# Patient Record
Sex: Female | Born: 1990 | Race: Black or African American | Hispanic: No | Marital: Single | Smoking: Never smoker
Health system: Southern US, Community
[De-identification: ages and names within clinical notes are randomized; demographics above are authoritative.]

---

## 2019-09-04 ENCOUNTER — Encounter: Payer: Self-pay | Admitting: Emergency Medicine

## 2019-09-04 ENCOUNTER — Emergency Department
Admission: EM | Admit: 2019-09-04 | Discharge: 2019-09-04 | Disposition: A | Discharge status: Home / Self Care | Payer: Self-pay | Attending: Emergency Medicine | Admitting: Emergency Medicine

## 2019-09-04 ENCOUNTER — Other Ambulatory Visit: Payer: Self-pay

## 2019-09-04 DIAGNOSIS — M545 Low back pain, unspecified: Secondary | ICD-10-CM

## 2019-09-04 DIAGNOSIS — M62830 Muscle spasm of back: Secondary | ICD-10-CM | POA: Insufficient documentation

## 2019-09-04 LAB — URINALYSIS, COMPLETE (UACMP) WITH MICROSCOPIC
Bilirubin Urine: NEGATIVE
Glucose, UA: NEGATIVE mg/dL
Hgb urine dipstick: NEGATIVE
Ketones, ur: NEGATIVE mg/dL
Leukocytes,Ua: NEGATIVE
Nitrite: NEGATIVE
Protein, ur: NEGATIVE mg/dL
Specific Gravity, Urine: 1.021 (ref 1.005–1.030)
pH: 7 (ref 5.0–8.0)

## 2019-09-04 LAB — POCT PREGNANCY, URINE: Preg Test, Ur: NEGATIVE

## 2019-09-04 MED ORDER — METHOCARBAMOL 500 MG PO TABS
500.0000 mg | ORAL_TABLET | Freq: Once | ORAL | Status: AC
  Administered 2019-09-04: 500 mg via ORAL
  Filled 2019-09-04: qty 1

## 2019-09-04 MED ORDER — KETOROLAC TROMETHAMINE 30 MG/ML IJ SOLN
30.0000 mg | Freq: Once | INTRAMUSCULAR | Status: AC
  Administered 2019-09-04: 30 mg via INTRAMUSCULAR
  Filled 2019-09-04: qty 1

## 2019-09-04 MED ORDER — KETOROLAC TROMETHAMINE 10 MG PO TABS
10.0000 mg | ORAL_TABLET | Freq: Four times a day (QID) | ORAL | 0 refills | Status: DC | PRN
Start: 2019-09-04 — End: 2021-07-24

## 2019-09-04 MED ORDER — CYCLOBENZAPRINE HCL 5 MG PO TABS
ORAL_TABLET | ORAL | 0 refills | Status: DC
Start: 2019-09-04 — End: 2021-07-24

## 2019-09-04 NOTE — ED Triage Notes (Signed)
Pt reports pain to her back. Pt denies obvious injuries and reports she has been working ut for 2 weeks but doesn't;t think that is what cause it. Pt reports it feels like spasms

## 2019-09-04 NOTE — ED Notes (Signed)
See triage note  Presents with mid back pain   States pain started yesterday w/o injury  Area is tender with touch

## 2019-09-04 NOTE — ED Provider Notes (Signed)
[facility] Emergency Department Provider Note  ____________________________________________  Time seen: Approximately 10:38 AM  I have reviewed the triage vital signs and the nursing notes.   HISTORY  Chief Complaint Back Pain    HPI Chelsea Oneill is a 29 y.o. female that presents to the emergency department for evaluation of low to mid back pain for 2 days.  Patient describes the pain as "spasms." Patient started working out 2 weeks ago.  She has been doing squats and lifting.  At first, her whole body was sore.  Now she just has pain to her low to mid back.  No recent illness.  No shortness of breath, chest pain, upper back pain, abdominal pain.  No dysuria, urgency, frequency.  No nausea, vomiting, abdominal pain.  History reviewed. No pertinent past medical history.  There are no problems to display for this patient.   History reviewed. No pertinent surgical history.  Prior to Admission medications   Medication Sig Start Date End Date Taking? Authorizing Provider  cyclobenzaprine (FLEXERIL) 5 MG tablet Take 1-2 tablets 3 times daily as needed 09/04/19   Laban Emperor, PA-C  ketorolac (TORADOL) 10 MG tablet Take 1 tablet (10 mg total) by mouth every 6 (six) hours as needed. 09/04/19   Laban Emperor, PA-C    Allergies Patient has no allergy information on record.  No family history on file.  Social History Social History   Tobacco Use  . Smoking status: Not on file  Substance Use Topics  . Alcohol use: Not on file  . Drug use: Not on file     Review of Systems  Constitutional: No fever/chills ENT: No upper respiratory complaints. Cardiovascular: No chest pain. Respiratory: No cough. No SOB. Gastrointestinal: No abdominal pain.  No nausea, no vomiting.  Genitourinary: No urinary symptoms. No dysuria, frequency, hematuria. Musculoskeletal: Positive for back pain. Skin: Negative for rash, abrasions, lacerations,  ecchymosis. Neurological: Negative for headaches, numbness or tingling   ____________________________________________   PHYSICAL EXAM:  VITAL SIGNS: ED Triage Vitals  Enc Vitals Group     BP 09/04/19 0945 118/62     Pulse Rate 09/04/19 0945 96     Resp 09/04/19 0945 20     Temp 09/04/19 0945 98.4 F (36.9 C)     Temp Source 09/04/19 0945 Oral     SpO2 09/04/19 0945 100 %     Weight 09/04/19 0942 200 lb (90.7 kg)     Height 09/04/19 0942 5\' 5"  (1.651 m)     Head Circumference --      Peak Flow --      Pain Score 09/04/19 0941 10     Pain Loc --      Pain Edu? --      Excl. in [place]? --      Constitutional: Alert and oriented. Well appearing and in no acute distress. Eyes: Conjunctivae are normal. PERRL. EOMI. Head: Atraumatic. ENT:      Ears:      Nose: No congestion/rhinnorhea.      Mouth/Throat: Mucous membranes are moist.  Neck: No stridor. No cervical spine tenderness to palpation. Cardiovascular: Normal rate, regular rhythm.  Good peripheral circulation. Respiratory: Normal respiratory effort without tachypnea or retractions. Lungs CTAB. Good air entry to the bases with no decreased or absent breath sounds. Gastrointestinal: Bowel sounds 4 quadrants. Soft and nontender to palpation. No guarding or rigidity. No palpable masses. No distention. Musculoskeletal: Full range of motion to all extremities. No gross deformities appreciated.  Tenderness to palpation to mid to low back to bilateral lumbar paraspinal muscles.  No pinpoint tenderness to palpation over lumbar spine.  Strength equal in lower extremities bilaterally. Neurologic:  Normal speech and language. No gross focal neurologic deficits are appreciated.  Skin:  Skin is warm, dry and intact. No rash noted. Psychiatric: Mood and affect are normal. Speech and behavior are normal. Patient exhibits appropriate insight and judgement.   ____________________________________________   LABS (all labs ordered are  listed, but only abnormal results are displayed)  Labs Reviewed  URINALYSIS, COMPLETE (UACMP) WITH MICROSCOPIC - Abnormal; Notable for the following components:      Result Value   Color, Urine YELLOW (*)    APPearance HAZY (*)    Bacteria, UA FEW (*)    All other components within normal limits  URINE CULTURE  POC URINE PREG, ED  POCT PREGNANCY, URINE   ____________________________________________  EKG   ____________________________________________  RADIOLOGY   No results found.  ____________________________________________    PROCEDURES  Procedure(s) performed:    Procedures    Medications  ketorolac (TORADOL) 30 MG/ML injection 30 mg (30 mg Intramuscular Given 09/04/19 1141)  methocarbamol (ROBAXIN) tablet 500 mg (500 mg Oral Given 09/04/19 1141)     ____________________________________________   INITIAL IMPRESSION / ASSESSMENT AND PLAN / ED COURSE  Pertinent labs & imaging results that were available during my care of the patient were reviewed by me and considered in my medical decision making (see chart for details).  Review of the [place] CSRS was performed in accordance of the [facility] prior to dispensing any controlled drugs.     Patient's diagnosis is consistent with muscle strain and spasm.  Vital signs and exam are reassuring. Exam is most consistent with muscle splasms.  Urinalysis shows few bacteria, which is likely from contamination.  Patient denies any urinary symptoms.  Urine will be sent for culture.  X-ray was offered and patient declines.  She was given IM Toradol and Robaxin for pain with relief of symptoms.  Patient will be discharged home with prescriptions for Toradol and Flexeril.  Patient is to follow up with primary care as directed. Patient is given ED precautions to return to the ED for any worsening or new symptoms.   Chelsea Oneill was evaluated in Emergency Department on 09/04/2019 for the symptoms described in the history of present  illness. She was evaluated in the context of the global COVID-19 pandemic, which necessitated consideration that the patient might be at risk for infection with the SARS-CoV-2 virus that causes COVID-19. Institutional protocols and algorithms that pertain to the evaluation of patients at risk for COVID-19 are in a state of rapid change based on information released by regulatory bodies including the CDC and federal and state organizations. These policies and algorithms were followed during the patient's care in the ED.  ____________________________________________  FINAL CLINICAL IMPRESSION(S) / ED DIAGNOSES  Final diagnoses:  Acute bilateral low back pain without sciatica      NEW MEDICATIONS STARTED DURING THIS VISIT:  ED Discharge Orders         Ordered    ketorolac (TORADOL) 10 MG tablet  Every 6 hours PRN     09/04/19 1233    cyclobenzaprine (FLEXERIL) 5 MG tablet     09/04/19 1233              This chart was dictated using voice recognition software/Dragon. Despite best efforts to proofread, errors can occur which can change the meaning. Any change  was purely unintentional.    Enid Derry, PA-C 09/04/19 1324    Arnaldo Natal, MD 09/04/19 1537

## 2019-09-12 LAB — SUSCEPTIBILITY RESULT

## 2019-09-12 LAB — SUSCEPTIBILITY, AER + ANAEROB: Source of Sample: 8860

## 2019-09-13 LAB — URINE CULTURE: Culture: >=100000 — AB

## 2020-04-20 ENCOUNTER — Encounter: Payer: Self-pay | Admitting: Emergency Medicine

## 2020-04-20 ENCOUNTER — Emergency Department
Admission: EM | Admit: 2020-04-20 | Discharge: 2020-04-20 | Disposition: A | Discharge status: Home / Self Care | Payer: HRSA Program | Attending: Emergency Medicine | Admitting: Emergency Medicine

## 2020-04-20 ENCOUNTER — Other Ambulatory Visit: Payer: Self-pay

## 2020-04-20 DIAGNOSIS — U071 COVID-19: Secondary | ICD-10-CM | POA: Insufficient documentation

## 2020-04-20 DIAGNOSIS — M7918 Myalgia, other site: Secondary | ICD-10-CM | POA: Diagnosis present

## 2020-04-20 LAB — POC SARS CORONAVIRUS 2 AG -  ED: SARS Coronavirus 2 Ag: POSITIVE — AB

## 2020-04-20 MED ORDER — ONDANSETRON 4 MG PO TBDP
ORAL_TABLET | ORAL | Status: AC
  Administered 2020-04-20: 4 mg
  Filled 2020-04-20: qty 1

## 2020-04-20 MED ORDER — NAPROXEN 500 MG PO TABS
500.0000 mg | ORAL_TABLET | Freq: Once | ORAL | Status: AC
  Administered 2020-04-20: 500 mg via ORAL
  Filled 2020-04-20: qty 1

## 2020-04-20 MED ORDER — ONDANSETRON 4 MG PO TBDP
4.0000 mg | ORAL_TABLET | Freq: Once | ORAL | Status: AC
  Administered 2020-04-20: 4 mg via ORAL
  Filled 2020-04-20: qty 1

## 2020-04-20 MED ORDER — ONDANSETRON 4 MG PO TBDP: 4.0000 mg | ORAL_TABLET | Freq: Three times a day (TID) | ORAL | 0 refills | Status: DC | PRN

## 2020-04-20 MED ORDER — NAPROXEN 500 MG PO TABS: 500.0000 mg | ORAL_TABLET | Freq: Two times a day (BID) | ORAL | 0 refills | Status: DC

## 2020-04-20 NOTE — ED Triage Notes (Signed)
C/O headache and body aches since last night.  AAOx3.  Patient moaning while in triage

## 2020-04-20 NOTE — ED Notes (Addendum)
Pt requesting to stay and be seen.  Loralyn Freshwater NP at bedside . Pt calming, c/o of head to toe pain , headache , body aches

## 2020-04-20 NOTE — ED Provider Notes (Signed)
[facility] Emergency Department Provider Note  ____________________________________________  Time seen: Approximately 10:16 AM  I have reviewed the triage vital signs and the nursing notes.   HISTORY  Chief Complaint Generalized Body Aches and Headache   HPI Velisa Regnier is a 30 y.o. female who presents to the emergency department for treatment and evaluation of bodyaches, headache, and nausea since last night. She took ibuprofen without relief. Others at home have similar symptoms, but no one has been tested for COVID.    History reviewed. No pertinent past medical history.  There are no problems to display for this patient.   History reviewed. No pertinent surgical history.  Prior to Admission medications   Medication Sig Start Date End Date Taking? Authorizing Provider  naproxen (NAPROSYN) 500 MG tablet Take 1 tablet (500 mg total) by mouth 2 (two) times daily with a meal. 04/20/20  Yes Meyli Boice B, FNP  ondansetron (ZOFRAN-ODT) 4 MG disintegrating tablet Take 1 tablet (4 mg total) by mouth every 8 (eight) hours as needed for nausea or vomiting. 04/20/20  Yes Aleeah Greeno B, FNP  cyclobenzaprine (FLEXERIL) 5 MG tablet Take 1-2 tablets 3 times daily as needed 09/04/19   Enid Derry, PA-C  ketorolac (TORADOL) 10 MG tablet Take 1 tablet (10 mg total) by mouth every 6 (six) hours as needed. 09/04/19   Enid Derry, PA-C    Allergies Patient has no known allergies.  No family history on file.  Social History Social History   Tobacco Use  . Smoking status: Never Smoker  . Smokeless tobacco: Never Used    Review of Systems Constitutional: Positive for fever/chills. Decreased appetite. ENT: Positive for sore throat. Cardiovascular: Denies chest pain. Respiratory: Negative for shortness of breath. Positive for cough. Negative for wheezing.  Gastrointestinal: Positive for nausea,  Negative for vomiting.  no diarrhea.  Musculoskeletal:  Positive for body aches Skin: Negative for rash. Neurological: Positive for headaches ____________________________________________   PHYSICAL EXAM:  VITAL SIGNS: ED Triage Vitals  Enc Vitals Group     BP 04/20/20 0849 122/79     Pulse Rate 04/20/20 0849 (!) 104     Resp 04/20/20 0849 (!) 40     Temp 04/20/20 0849 99.1 F (37.3 C)     Temp Source 04/20/20 0849 Oral     SpO2 04/20/20 0849 100 %     Weight 04/20/20 0845 199 lb 15.3 oz (90.7 kg)     Height 04/20/20 0845 5\' 5"  (1.651 m)     Head Circumference --      Peak Flow --      Pain Score 04/20/20 0844 10     Pain Loc --      Pain Edu? --      Excl. in [place]? --     Constitutional: Alert and oriented. Uncomfortable appearing and in no acute distress. Eyes: Conjunctivae are normal. Ears: exam deferred Nose: No sinus congestion noted; no rhinnorhea. Mouth/Throat: Mucous membranes are moist.  Oropharynx erythematous. Tonsils normal. Uvula midline. Neck: No stridor.  Lymphatic: No cervical lymphadenopathy. Cardiovascular: Normal rate, regular rhythm. Good peripheral circulation. Respiratory: Respirations are even and unlabored.  No retractions. Breath sounds clear to auscultation. Gastrointestinal: Soft and nontender.  Musculoskeletal: FROM x 4 extremities.  Neurologic:  Normal speech and language. Skin:  Skin is warm, dry and intact. No rash noted. Psychiatric: Mood and affect are normal. Speech and behavior are normal.  ____________________________________________   LABS (all labs ordered are listed, but only abnormal results  are displayed)  Labs Reviewed  POC SARS CORONAVIRUS 2 AG -  ED - Abnormal; Notable for the following components:      Result Value   SARS Coronavirus 2 Ag POSITIVE (*)    All other components within normal limits   ____________________________________________  EKG  Not indicated. ____________________________________________  RADIOLOGY  Not  indicated. ____________________________________________   PROCEDURES  Procedure(s) performed: None  Critical Care performed: No ____________________________________________   INITIAL IMPRESSION / ASSESSMENT AND PLAN / ED COURSE  30 y.o. female presents to the emergency department for treatment and evaluation of COVID symptoms.   Patient initially found by RN lying in the floor where she had spit. She had also turned the water on in the sink and left it running. Patient states that she did all this because her body hurts, she doesn't feel good, and the floor feels better than the bed. She became very upset when the RN asked her to get off the floor so she could be evaluated. She proceeded to curse and shout until security staff came to speak with her. We were able to reach an agreement for her to be evaluated after security staff spoke with her.  COVID antigen test is positive. While here she received naprosyn and zofran with relief of headache and nausea. She was apologetic of her earlier actions.  She will be discharged home with zofran and naprosyn. She was given quarantine instructions as well. She was advised to return to the ER for symptoms that change or worsen or for new concerns.   Medications  naproxen (NAPROSYN) tablet 500 mg (500 mg Oral Given 04/20/20 0952)  ondansetron (ZOFRAN-ODT) disintegrating tablet 4 mg (4 mg Oral Given 04/20/20 0955)  ondansetron (ZOFRAN-ODT) 4 MG disintegrating tablet (4 mg  Given 04/20/20 8416)    ED Discharge Orders         Ordered    naproxen (NAPROSYN) 500 MG tablet  2 times daily with meals        04/20/20 1022    ondansetron (ZOFRAN-ODT) 4 MG disintegrating tablet  Every 8 hours PRN        04/20/20 1022           Pertinent labs & imaging results that were available during my care of the patient were reviewed by me and considered in my medical decision making (see chart for details).    If controlled substance prescribed during this  visit, 12 month history viewed on the [place] prior to issuing an initial prescription for Schedule II or III opiod. ____________________________________________   FINAL CLINICAL IMPRESSION(S) / ED DIAGNOSES  Final diagnoses:  COVID    Note:  This document was prepared using Dragon voice recognition software and may include unintentional dictation errors.    Chinita Pester, FNP 04/21/20 1812    Sharman Cheek, MD 04/21/20 2258

## 2020-04-20 NOTE — ED Notes (Signed)
Registration saw the patient lying on the floor with her head on a fanny pack and spit on the floor and the sink running. This Clinical research associate went In and told the patient that she needed to get up onto the stretcher or chair so she could be assessed and patient said she was too sick. Patient then became agitated and argumentative and got up by herself and started yelling that this Clinical research associate was rude. This Clinical research associate said she would leave, but the patient needed to calm down and sit on the chair or on the stretcher so we could take care of her. Patient began pacing, swearing and still yelling. NP is aware. Security called to speak with the patient. Patient also yelled at registration.

## 2020-04-20 NOTE — Discharge Instructions (Signed)
Take medications as prescribed.  Follow up with the primary care provider of your choice for symptoms of concern.

## 2020-07-06 ENCOUNTER — Emergency Department
Admission: EM | Admit: 2020-07-06 | Discharge: 2020-07-06 | Disposition: A | Discharge status: Home / Self Care | Payer: Medicaid Other | Attending: Emergency Medicine | Admitting: Emergency Medicine

## 2020-07-06 ENCOUNTER — Emergency Department: Payer: Medicaid Other

## 2020-07-06 ENCOUNTER — Other Ambulatory Visit: Payer: Self-pay

## 2020-07-06 ENCOUNTER — Encounter: Payer: Self-pay | Admitting: Emergency Medicine

## 2020-07-06 DIAGNOSIS — B070 Plantar wart: Secondary | ICD-10-CM

## 2020-07-06 NOTE — Discharge Instructions (Signed)
Please alternate Tylenol and ibuprofen as needed for symptoms.  Please follow-up with podiatry as soon as possible for treatment for suspected plantars wart.

## 2020-07-06 NOTE — ED Triage Notes (Signed)
Pt comes into the ED via POV c/o right great toe pain on the underside. Pt denies any injury to the toe and patient ambulatory to triage.

## 2020-07-06 NOTE — ED Provider Notes (Signed)
[facility] Emergency Department Provider Note  ____________________________________________   Event Date/Time   First MD Initiated Contact with Patient 07/06/20 1159     (approximate)  I have reviewed the triage vital signs and the nursing notes.   HISTORY  Chief Complaint Toe Pain  HPI Chelsea Oneill is a 30 y.o. female who presents to emergency department for evaluation of right great toe pain.  Patient states the pain on the underside has been there for roughly 3 to 4 months.  She states that the pain is so significant she is now walking on the outside of her foot causing pain on the outside.  She denies any known trauma or injury that occurred causing the symptoms.  She denies attempting to treat this with any over-the-counter medications.       History reviewed. No pertinent past medical history.  There are no problems to display for this patient.   Past Surgical History:  Procedure Laterality Date  . CESAREAN SECTION      Prior to Admission medications   Medication Sig Start Date End Date Taking? Authorizing Provider  cyclobenzaprine (FLEXERIL) 5 MG tablet Take 1-2 tablets 3 times daily as needed 09/04/19   Enid Derry, PA-C  ketorolac (TORADOL) 10 MG tablet Take 1 tablet (10 mg total) by mouth every 6 (six) hours as needed. 09/04/19   Enid Derry, PA-C  naproxen (NAPROSYN) 500 MG tablet Take 1 tablet (500 mg total) by mouth 2 (two) times daily with a meal. 04/20/20   Triplett, Cari B, FNP  ondansetron (ZOFRAN-ODT) 4 MG disintegrating tablet Take 1 tablet (4 mg total) by mouth every 8 (eight) hours as needed for nausea or vomiting. 04/20/20   Chinita Pester, FNP    Allergies Patient has no known allergies.  History reviewed. No pertinent family history.  Social History Social History   Tobacco Use  . Smoking status: Never Smoker  . Smokeless tobacco: Never Used  Substance Use Topics  . Alcohol use: Not Currently  . Drug use:  Yes    Types: Marijuana    Review of Systems Constitutional: No fever/chills Eyes: No visual changes. ENT: No sore throat. Cardiovascular: Denies chest pain. Respiratory: Denies shortness of breath. Gastrointestinal: No abdominal pain.  No nausea, no vomiting.  No diarrhea.  No constipation. Genitourinary: Negative for dysuria. Musculoskeletal: + Right foot pain, negative for back pain. Skin: Negative for rash. Neurological: Negative for headaches, focal weakness or numbness.  ____________________________________________   PHYSICAL EXAM:  VITAL SIGNS: ED Triage Vitals [07/06/20 1138]  Enc Vitals Group     BP 125/68     Pulse Rate 75     Resp 17     Temp 98.2 F (36.8 C)     Temp Source Oral     SpO2 99 %     Weight 179 lb (81.2 kg)     Height 5\' 5"  (1.651 m)     Head Circumference      Peak Flow      Pain Score 8     Pain Loc      Pain Edu?      Excl. in [place]?    Constitutional: Alert and oriented. Well appearing and in no acute distress. Eyes: Conjunctivae are normal. PERRL. EOMI. Head: Atraumatic. Nose: No congestion/rhinnorhea. Mouth/Throat: Mucous membranes are moist.  Oropharynx non-erythematous. Neck: No stridor.   Musculoskeletal: There is tenderness to the plantar surface of the right great toe.  At this location, there is a  1 cm x 1 cm raised callus appearing lesion with dark center, concerning for possible plantars wart.  There is no erythema or warmth over the first MTP joint line.  Range of motion of the digits is full.  Dorsal pedal pulse 2+, capillary refill less than 3 seconds all digits. Neurologic:  Normal speech and language. No gross focal neurologic deficits are appreciated.  Skin:  Skin is warm, dry and intact. No rash noted. Psychiatric: Mood and affect are normal. Speech and behavior are normal.  ____________________________________________  RADIOLOGY I, Lucy Chris, personally viewed and evaluated these images (plain radiographs) as  part of my medical decision making, as well as reviewing the written report by the radiologist.  ED provider interpretation: No significant displaced fracture, subtle area in the distal phalanx noted by radiology to be nondisplaced fracture versus vascular channel is favored by myself to be probable vascular channel in the absence of trauma and given that patient's pain is closer to the MTP joint.  Official radiology report(s): DG Toe Great Right  Result Date: 07/06/2020 CLINICAL DATA:  Right toe pain.  No injury. EXAM: RIGHT GREAT TOE COMPARISON:  No prior. FINDINGS: A very subtle nondisplaced fracture versus prominent vascular channel noted of the distal phalanx of the right great toe. Small exostosis noted along the medial base of the distal phalanx. No radiopaque foreign body. IMPRESSION: 1. A very subtle nondisplaced fracture versus prominent vascular channel noted of the distal phalanx of the right great toe. No evidence of displaced fracture or dislocation. 2. Small exostosis noted along the medial base of the distal phalanx of the right great toe. Electronically Signed   By: Maisie Fus  Register   On: 07/06/2020 13:19   ____________________________________________   INITIAL IMPRESSION / ASSESSMENT AND PLAN / ED COURSE  As part of my medical decision making, I reviewed the following data within the electronic MEDICAL RECORD NUMBER Nursing notes reviewed and incorporated, Radiograph reviewed and Notes from prior ED visits        Patient is a 30 year old female who presents to the emergency department for evaluation of right great toe pain that has been worsening over the last several months, see HPI for further details.  In triage, patient has normal vital signs.  On physical exam, she does have a soft tissue growth on the plantar surface of the right great toe, close to the MTP joint line.  This is the area of tenderness for her and the appearance is concerning for plantars wart.  Advised the  patient the lack of treatment options for this in the ED, and recommended close follow-up with podiatry.  She is amenable with this plan, will refer the patient to podiatry for definitive management.  Patient is stable at this time for outpatient follow-up.      ____________________________________________   FINAL CLINICAL IMPRESSION(S) / ED DIAGNOSES  Final diagnoses:  Plantar wart of right foot     ED Discharge Orders    None      *Please note:  Chelsea Oneill was evaluated in Emergency Department on 07/06/2020 for the symptoms described in the history of present illness. She was evaluated in the context of the global COVID-19 pandemic, which necessitated consideration that the patient might be at risk for infection with the SARS-CoV-2 virus that causes COVID-19. Institutional protocols and algorithms that pertain to the evaluation of patients at risk for COVID-19 are in a state of rapid change based on information released by regulatory bodies including the CDC and  federal and state organizations. These policies and algorithms were followed during the patient's care in the ED.  Some ED evaluations and interventions may be delayed as a result of limited staffing during and the pandemic.*   Note:  This document was prepared using Dragon voice recognition software and may include unintentional dictation errors.   Lucy Chris, PA 07/07/20 4650    Concha Se, MD 07/07/20 [phone]

## 2020-07-28 ENCOUNTER — Other Ambulatory Visit: Payer: Self-pay

## 2020-07-28 ENCOUNTER — Ambulatory Visit (LOCAL_COMMUNITY_HEALTH_CENTER): Payer: Self-pay

## 2020-07-28 VITALS — BP 97/68 | Ht 65.5 in | Wt 184.0 lb

## 2020-07-28 DIAGNOSIS — Z3201 Encounter for pregnancy test, result positive: Secondary | ICD-10-CM

## 2020-07-28 LAB — PREGNANCY, URINE: Preg Test, Ur: POSITIVE — AB

## 2020-07-28 MED ORDER — PRENATAL 27-0.8 MG PO TABS: 1.0000 | ORAL_TABLET | Freq: Every day | ORAL | 0 refills | Status: AC

## 2020-07-28 NOTE — Progress Notes (Signed)
UPT positive today. Plans prenatal care at [facility]. Advised to establish prenatal care early as pt reports "thin uterine wall." To DSS clerk for Medicaid/Preg W. Jerel Shepherd, RN

## 2021-07-24 ENCOUNTER — Emergency Department: Payer: BC Managed Care – PPO

## 2021-07-24 ENCOUNTER — Other Ambulatory Visit: Payer: Self-pay

## 2021-07-24 ENCOUNTER — Encounter: Payer: Self-pay | Admitting: Emergency Medicine

## 2021-07-24 ENCOUNTER — Emergency Department
Admission: EM | Admit: 2021-07-24 | Discharge: 2021-07-24 | Disposition: A | Discharge status: Home / Self Care | Payer: BC Managed Care – PPO | Attending: Emergency Medicine | Admitting: Emergency Medicine

## 2021-07-24 DIAGNOSIS — R0789 Other chest pain: Secondary | ICD-10-CM | POA: Diagnosis present

## 2021-07-24 LAB — CBC
HCT: 39.2 % (ref 36.0–46.0)
Hemoglobin: 12.3 g/dL (ref 12.0–15.0)
MCH: 26.6 pg (ref 26.0–34.0)
MCHC: 31.4 g/dL (ref 30.0–36.0)
MCV: 84.7 fL (ref 80.0–100.0)
Platelets: 366 10*3/uL (ref 150–400)
RBC: 4.63 MIL/uL (ref 3.87–5.11)
RDW: 19 % — ABNORMAL HIGH (ref 11.5–15.5)
WBC: 6.9 10*3/uL (ref 4.0–10.5)
nRBC: 0 % (ref 0.0–0.2)

## 2021-07-24 LAB — TROPONIN I (HIGH SENSITIVITY): Troponin I (High Sensitivity): 3 ng/L (ref <18)

## 2021-07-24 LAB — POC URINE PREG, ED: Preg Test, Ur: NEGATIVE

## 2021-07-24 MED ORDER — NAPROXEN 500 MG PO TABS
500.0000 mg | ORAL_TABLET | Freq: Once | ORAL | Status: AC
  Administered 2021-07-24: 500 mg via ORAL
  Filled 2021-07-24: qty 1

## 2021-07-24 MED ORDER — NAPROXEN 500 MG PO TABS: 500.0000 mg | ORAL_TABLET | Freq: Two times a day (BID) | ORAL | 2 refills | Status: AC

## 2021-07-24 NOTE — ED Triage Notes (Signed)
Pt with chest pain that started last week. Pt reports pain worse with exertion. Pain 5/10 now. ?

## 2021-07-24 NOTE — ED Notes (Signed)
See triage note  presents with some chest pain since last week  states pain has changed in intensity of the past few days  denies any fever ,cough or n/v  states she has gotten min relief with OTC meds ?

## 2021-07-24 NOTE — ED Provider Notes (Signed)
? ?[facility] ?Provider Note ? ? ? Event Date/Time  ? First MD Initiated Contact with Patient 07/24/21 1157   ?  (approximate) ? ? ?History  ? ?Chest Pain ? ? ?HPI ? ?Chelsea Oneill is a 31 y.o. female who presents with complaints of left-sided chest discomfort.  Patient reports this is been ongoing for about 2 to 3 days.  It is worse when she moves her arm in certain ways.  She denies fevers or chills.  No shortness of breath.  No pleurisy.  No injury to the area.  No history of heart disease ?  ? ? ?Physical Exam  ? ?Triage Vital Signs: ?ED Triage Vitals  ?Enc Vitals Group  ?   BP 07/24/21 1147 (!) 129/97  ?   Pulse Rate 07/24/21 1147 76  ?   Resp 07/24/21 1147 18  ?   Temp 07/24/21 1147 98.5 ?F (36.9 ?C)  ?   Temp Source 07/24/21 1147 Oral  ?   SpO2 07/24/21 1147 99 %  ?   Weight 07/24/21 1151 86.6 kg (191 lb)  ?   Height 07/24/21 1151 1.651 m (5\' 5" )  ?   Head Circumference --   ?   Peak Flow --   ?   Pain Score 07/24/21 1151 5  ?   Pain Loc --   ?   Pain Edu? --   ?   Excl. in GC? --   ? ? ?Most recent vital signs: ?Vitals:  ? 07/24/21 1338 07/24/21 1345  ?BP:  103/75  ?Pulse:  81  ?Resp: 14 14  ?Temp:  98 ?F (36.7 ?C)  ?SpO2:  97%  ? ? ? ?General: Awake, no distress.  ?CV:  Good peripheral perfusion.  Patient has point tenderness overlying the left pectoralis muscle which completely reproduces her pain, no rash or bruising ?Resp:  Normal effort.  ?Abd:  No distention.  ?Other:  No calf pain or swelling ? ? ?ED Results / Procedures / Treatments  ? ?Labs ?(all labs ordered are listed, but only abnormal results are displayed) ?Labs Reviewed  ?CBC - Abnormal; Notable for the following components:  ?    Result Value  ? RDW 19.0 (*)   ? All other components within normal limits  ?BASIC METABOLIC PANEL  ?POC URINE PREG, ED  ?TROPONIN I (HIGH SENSITIVITY)  ?TROPONIN I (HIGH SENSITIVITY)  ? ? ? ?EKG ? ?ED ECG REPORT ?I, 07/26/21, the attending physician, personally viewed and interpreted  this ECG. ? ?Date: 07/24/2021 ? ?Rhythm: normal sinus rhythm ?QRS Axis: normal ?Intervals: normal ?ST/T Wave abnormalities: normal ?Narrative Interpretation: no evidence of acute ischemia ? ? ? ?RADIOLOGY ?Chest x-ray viewed interpreted by me, no acute abnormality ? ? ? ?PROCEDURES: ? ?Critical Care performed:  ? ?Procedures ? ? ?MEDICATIONS ORDERED IN ED: ?Medications  ?naproxen (NAPROSYN) tablet 500 mg (500 mg Oral Given 07/24/21 1325)  ? ? ? ?IMPRESSION / MDM / ASSESSMENT AND PLAN / ED COURSE  ?I reviewed the triage vital signs and the nursing notes. ? ?Patient well-appearing and in no acute distress, EKG is reassuring ? ?Symptoms not consistent with ACS, reassuring EKG, high sensitive troponin is normal. ? ?She has point tenderness in the area consistent with musculoskeletal pain. ? ?Chest x-ray is negative for pneumonia or pneumothorax ? ?Patient treated with NSAIDs, prescription provided, outpatient follow-up recommended.  Return precautions discussed ? ? ? ? ? ?  ? ? ?FINAL CLINICAL IMPRESSION(S) / ED DIAGNOSES  ? ?Final  diagnoses:  ?Chest wall pain  ? ? ? ?Rx / DC Orders  ? ?ED Discharge Orders   ? ?      Ordered  ?  naproxen (NAPROSYN) 500 MG tablet  2 times daily with meals       ? 07/24/21 1326  ? ?  ?  ? ?  ? ? ? ?Note:  This document was prepared using Dragon voice recognition software and may include unintentional dictation errors. ?  ?Jene Every, MD ?07/24/21 1606 ? ?

## 2021-11-17 ENCOUNTER — Emergency Department
Admission: EM | Admit: 2021-11-17 | Discharge: 2021-11-17 | Disposition: A | Discharge status: Home / Self Care | Payer: BC Managed Care – PPO | Attending: Emergency Medicine | Admitting: Emergency Medicine

## 2021-11-17 ENCOUNTER — Telehealth: Payer: Self-pay | Admitting: Emergency Medicine

## 2021-11-17 ENCOUNTER — Other Ambulatory Visit: Payer: Self-pay

## 2021-11-17 DIAGNOSIS — S0993XA Unspecified injury of face, initial encounter: Secondary | ICD-10-CM | POA: Diagnosis present

## 2021-11-17 DIAGNOSIS — S025XXB Fracture of tooth (traumatic), initial encounter for open fracture: Secondary | ICD-10-CM | POA: Diagnosis not present

## 2021-11-17 DIAGNOSIS — K047 Periapical abscess without sinus: Secondary | ICD-10-CM

## 2021-11-17 DIAGNOSIS — X58XXXA Exposure to other specified factors, initial encounter: Secondary | ICD-10-CM | POA: Insufficient documentation

## 2021-11-17 MED ORDER — LIDOCAINE-EPINEPHRINE 2 %-1:100000 IJ SOLN
1.7000 mL | Freq: Once | INTRAMUSCULAR | Status: AC
Start: 2021-11-17 — End: 2021-11-17
  Administered 2021-11-17: 1.7 mL
  Filled 2021-11-17: qty 1.7

## 2021-11-17 MED ORDER — AMOXICILLIN-POT CLAVULANATE 875-125 MG PO TABS: 1.0000 | ORAL_TABLET | Freq: Two times a day (BID) | ORAL | 0 refills | Status: AC

## 2021-11-17 NOTE — Telephone Encounter (Signed)
ABX added

## 2021-11-17 NOTE — ED Provider Notes (Signed)
[facility] Provider Note   Event Date/Time   First MD Initiated Contact with Patient 11/17/21 1242     (approximate) History  Dental Pain  HPI Chelsea Oneill is a 31 y.o. female with a stated past medical history of a fractured wisdom tooth on the right mandibular side.  Patient states that she was seen by dentist recently but was told that that dentist could not do surgery on her and referred her to an oral surgeon who cannot see her for the next 6 months.  Patient states that she has been having worsening right-sided tooth and jaw pain that is keeping her from eating solid foods.  Patient has been taking 800 mg ibuprofen as well as 1 g of Tylenol every 8 hours for pain control with little relief.  Patient currently describes this pain as 10/10 in severity ROS: Patient currently denies any vision changes, tinnitus, difficulty speaking, facial droop, sore throat, chest pain, shortness of breath, abdominal pain, nausea/vomiting/diarrhea, dysuria, or weakness/numbness/paresthesias in any extremity   Physical Exam  Triage Vital Signs: ED Triage Vitals  Enc Vitals Group     BP 11/17/21 1140 (!) 143/93     Pulse Rate 11/17/21 1140 82     Resp 11/17/21 1140 18     Temp 11/17/21 1140 98.6 F (37 C)     Temp Source 11/17/21 1140 Oral     SpO2 11/17/21 1140 100 %     Weight 11/17/21 1241 190 lb 14.7 oz (86.6 kg)     Height 11/17/21 1141 5\' 5"  (1.651 m)     Head Circumference --      Peak Flow --      Pain Score 11/17/21 1141 10     Pain Loc --      Pain Edu? --      Excl. in GC? --    Most recent vital signs: Vitals:   11/17/21 1140  BP: (!) 143/93  Pulse: 82  Resp: 18  Temp: 98.6 F (37 C)  SpO2: 100%   General: Awake, oriented x4. CV:  Good peripheral perfusion.  Resp:  Normal effort.  Abd:  No distention.  Other:  Middle-aged African-American female laying in bed in mild distress secondary to pain.  Tenderness to palpation over the right mandibular  wisdom tooth with a fracture apparent at the medial aspect down to the gumline.  There is mild surrounding erythema of the gum as well as right anterior cervical lymphadenopathy ED Results / Procedures / Treatments  PROCEDURES: Critical Care performed: No Procedures MEDICATIONS ORDERED IN ED: Medications  lidocaine-EPINEPHrine (XYLOCAINE W/EPI) 2 %-1:100000 (with pres) injection 1.7 mL (1.7 mLs Infiltration Not Given 11/17/21 1416)   IMPRESSION / MDM / ASSESSMENT AND PLAN / ED COURSE  I reviewed the triage vital signs and the nursing notes.                             The patient is on the cardiac monitor to evaluate for evidence of arrhythmia and/or significant heart rate changes. Patient's presentation is most consistent with acute presentation with potential threat to life or bodily function. Patient not immunosuppressed. No e/o tooth fracture, avulsion, or bleeding socket. No e/o RPA, PTA, Ludwigs angina, periapical abscess. No e/o gingival hyperplasia or concern for drug reaction.  Rx Norco, Augmentin Disposition: Discharge home. Discussed return precautions for odontogenic infections and other dental pain emergencies. Will provide dental clinic list.  FINAL CLINICAL IMPRESSION(S) / ED DIAGNOSES   Final diagnoses:  Open fracture of tooth, initial encounter  Dental infection   Rx / DC Orders   ED Discharge Orders     None      Note:  This document was prepared using Dragon voice recognition software and may include unintentional dictation errors.   Merwyn Katos, MD 11/17/21 [phone]

## 2021-11-17 NOTE — ED Triage Notes (Signed)
Pt to ED via POV from home. Pt reports wisdom teeth are in and is having increased right back tooth pain. Pt denies fevers at home. Pt reports no relief with OTC medications. Pt reports recent follow up with dentist and all the did was clean her teeth.

## 2021-11-17 NOTE — Discharge Instructions (Signed)
Please use ibuprofen (Motrin) up to 800 mg every 8 hours, naproxen (Naprosyn) up to 500 mg every 12 hours, and/or acetaminophen (Tylenol) up to 4 g/day for any continued pain.  You may also use a small piece of cotton swab or cotton ball that has Orajel on the top or any topical anesthetic placed at the site of your dental pain

## 2021-11-17 NOTE — ED Provider Triage Note (Signed)
Emergency Medicine Provider Triage Evaluation Note  Chelsea Oneill , a 31 y.o. female  was evaluated in triage.  Pt complains of lower jaw pain due to wisdom tooth pain on both sides. No relief with OTC medications. No fever or facial swelling.   Physical Exam  There were no vitals taken for this visit. Gen:   Awake, no distress   Resp:  Normal effort  MSK:   Moves extremities without difficulty  Other:    Medical Decision Making  Medically screening exam initiated at 11:40 AM.  Appropriate orders placed.  Dorthula Wessells was informed that the remainder of the evaluation will be completed by another provider, this initial triage assessment does not replace that evaluation, and the importance of remaining in the ED until their evaluation is complete.    Chinita Pester, FNP 11/17/21 1141

## 2022-03-14 ENCOUNTER — Encounter: Payer: Self-pay | Admitting: Nurse Practitioner

## 2022-03-14 ENCOUNTER — Ambulatory Visit: Payer: Self-pay | Admitting: Nurse Practitioner

## 2022-03-14 DIAGNOSIS — Z113 Encounter for screening for infections with a predominantly sexual mode of transmission: Secondary | ICD-10-CM

## 2022-03-14 DIAGNOSIS — F419 Anxiety disorder, unspecified: Secondary | ICD-10-CM

## 2022-03-14 LAB — HM HIV SCREENING LAB: HM HIV Screening: NEGATIVE

## 2022-03-14 LAB — HM HEPATITIS C SCREENING LAB: HM Hepatitis Screen: NEGATIVE

## 2022-03-14 LAB — WET PREP FOR TRICH, YEAST, CLUE
Trichomonas Exam: NEGATIVE
Yeast Exam: NEGATIVE

## 2022-03-14 LAB — HEPATITIS B SURFACE ANTIGEN: Hepatitis B Surface Ag: NONREACTIVE

## 2022-03-14 NOTE — Progress Notes (Addendum)
Patient states last week had sex and unable to retrieve condom from vagina. Would like visual exam to remove condom. [facility] Child psychotherapist Card given. In House Labs WNL. BThiele RN

## 2022-03-14 NOTE — Progress Notes (Unsigned)
[facility] Department  STI clinic/screening visit [address] [place] [address] [phone]  Subjective:  Chelsea Oneill is a 31 y.o. female being seen today for an STI screening visit. The patient reports they do not have symptoms.  Patient reports that they do not desire a pregnancy in the next year.   They reported they are not interested in discussing contraception today.    Patient's last menstrual period was 02/12/2022 (approximate).   Patient has the following medical conditions:  There are no problems to display for this patient.   Chief Complaint  Patient presents with   STI Screen    HPI  Patient reports to clinic today for STD screening.  Patient reports that she thinks she has a condoms retained since last Thursday.  Does the patient using douching products? No  Last HIV test per patient/review of record was: 03/2021 Patient reports last pap was: 03/2021  Screening for MPX risk: Does the patient have an unexplained rash? No Is the patient MSM? No Does the patient endorse multiple sex partners or anonymous sex partners? No Did the patient have close or sexual contact with a person diagnosed with MPX? No Has the patient traveled outside the [place] where MPX is endemic? No Is there a high clinical suspicion for MPX-- evidenced by one of the following No  -Unlikely to be chickenpox  -Lymphadenopathy  -Rash that present in same phase of evolution on any given body part See flowsheet for further details and programmatic requirements.   Immunization history:   There is no immunization history on file for this patient.   The following portions of the patient's history were reviewed and updated as appropriate: allergies, current medications, past medical history, past social history, past surgical history and problem list.  Objective:  There were no vitals filed for this visit.  Physical Exam Constitutional:      Appearance: Normal  appearance.  HENT:     Head: Normocephalic. No abrasion, masses or laceration. Hair is normal.     Right Ear: External ear normal.     Left Ear: External ear normal.     Nose: Nose normal.     Mouth/Throat:     Lips: Pink.     Mouth: Mucous membranes are moist. No oral lesions.     Pharynx: No oropharyngeal exudate or posterior oropharyngeal erythema.     Tonsils: No tonsillar exudate or tonsillar abscesses.  Eyes:     General: Lids are normal.        Right eye: No discharge.        Left eye: No discharge.     Conjunctiva/sclera: Conjunctivae normal.     Right eye: No exudate.    Left eye: No exudate. Abdominal:     General: Abdomen is flat.     Palpations: Abdomen is soft.     Tenderness: There is no abdominal tenderness. There is no rebound.  Genitourinary:    Pubic Area: No rash or pubic lice.      Labia:        Right: No rash, tenderness, lesion or injury.        Left: No rash, tenderness, lesion or injury.      Vagina: Normal. No vaginal discharge, erythema or lesions.     Cervix: No cervical motion tenderness, discharge, lesion or erythema.     Uterus: Not enlarged and not tender.      Rectum: Normal.     Comments: Amount Discharge: small  Odor: Yes pH: greater than 4.5 Adheres to vaginal wall: No Color: Chelsea Oneill Musculoskeletal:     Cervical back: Full passive range of motion without pain, normal range of motion and neck supple.  Lymphadenopathy:     Cervical: No cervical adenopathy.     Right cervical: No superficial, deep or posterior cervical adenopathy.    Left cervical: No superficial, deep or posterior cervical adenopathy.     Upper Body:     Right upper body: No supraclavicular, axillary or epitrochlear adenopathy.     Left upper body: No supraclavicular, axillary or epitrochlear adenopathy.     Lower Body: No right inguinal adenopathy. No left inguinal adenopathy.  Skin:    General: Skin is warm and dry.     Findings: No lesion or rash.  Neurological:      Mental Status: She is alert and oriented to person, place, and time.  Psychiatric:        Attention and Perception: Attention normal.        Mood and Affect: Mood normal.        Speech: Speech normal.        Behavior: Behavior normal. Behavior is cooperative.      Assessment and Plan:  Chelsea Oneill is a 31 y.o. female presenting to the [facility] Department for STI screening  1. Screening examination for venereal disease -31 year old female in clinic for STD screening and to assess for a retained condom.   -Patient accepted all screenings including oral- GC, vaginal CT/GC and bloodwork for HIV/RPR.  Patient meets criteria for HepB screening? Yes. Ordered? Yes Patient meets criteria for HepC screening? Yes. Ordered? Yes  Treat wet prep per standing order Discussed time line for [facility] results and that patient will be called with positive results and encouraged patient to call if she had not heard in 2 weeks.  Counseled to return or seek care for continued or worsening symptoms Recommended condom use with all sex  Patient is currently not using  contraception  to prevent pregnancy.    - Syphilis Serology, [place] [facility] - Chlamydia/Gonorrhea [place] [facility] - HIV/HCV [place] [facility] - HBV Antigen/Antibody [facility] - Gonococcus culture - WET PREP FOR TRICH, YEAST, CLUE  2. Anxiety -Reports history of depression and anxiety.  Agrees to counseling referral.  - Ambulatory referral to Behavioral Health   Total time spent: 30 minutes   Return if symptoms worsen or fail to improve.   Glenna Fellows, FNP

## 2022-03-19 LAB — GONOCOCCUS CULTURE

## 2023-01-10 ENCOUNTER — Emergency Department
Admission: EM | Admit: 2023-01-10 | Discharge: 2023-01-10 | Disposition: A | Discharge status: Home / Self Care | Payer: BC Managed Care – PPO | Attending: Student in an Organized Health Care Education/Training Program | Admitting: Student in an Organized Health Care Education/Training Program

## 2023-01-10 ENCOUNTER — Other Ambulatory Visit: Payer: Self-pay

## 2023-01-10 DIAGNOSIS — K0889 Other specified disorders of teeth and supporting structures: Secondary | ICD-10-CM | POA: Diagnosis present

## 2023-01-10 MED ORDER — OXYCODONE-ACETAMINOPHEN 5-325 MG PO TABS
1.0000 | ORAL_TABLET | Freq: Once | ORAL | Status: AC
  Administered 2023-01-10: 1 via ORAL
  Filled 2023-01-10: qty 1

## 2023-01-10 MED ORDER — AMOXICILLIN 500 MG PO TABS: 500.0000 mg | ORAL_TABLET | Freq: Three times a day (TID) | ORAL | 0 refills | Status: AC

## 2023-01-10 MED ORDER — AMOXICILLIN 500 MG PO CAPS
500.0000 mg | ORAL_CAPSULE | Freq: Once | ORAL | Status: AC
  Administered 2023-01-10: 500 mg via ORAL
  Filled 2023-01-10: qty 1

## 2023-01-10 MED ORDER — OXYCODONE-ACETAMINOPHEN 5-325 MG PO TABS: 1.0000 | ORAL_TABLET | ORAL | 0 refills | Status: AC | PRN

## 2023-01-10 NOTE — ED Triage Notes (Signed)
Pt presents via POV c/o dental pain x3 day and left sided facial swelling today. Unknown if fever at home. Pain unrelieved with OTC meds.

## 2023-01-10 NOTE — ED Provider Notes (Signed)
[facility] Provider Note    Event Date/Time   First MD Initiated Contact with Patient 01/10/23 2109     (approximate)   History   Dental Pain   HPI  Chelsea Oneill is a 32 y.o. female with no PMH presents for evaluation of left-sided upper dental pain.  Patient states she has tried to manage her pain at home with ibuprofen, Tylenol and Excedrin.  She states it only provides temporary relief.  She denies fevers and chills.  She does endorse facial swelling.      Physical Exam   Triage Vital Signs: ED Triage Vitals [01/10/23 2048]  Encounter Vitals Group     BP 136/86     Systolic BP Percentile      Diastolic BP Percentile      Pulse Rate (!) 108     Resp 16     Temp 98.8 F (37.1 C)     Temp Source Oral     SpO2 100 %     Weight 185 lb (83.9 kg)     Height 5\' 6"  (1.676 m)     Head Circumference      Peak Flow      Pain Score 10     Pain Loc      Pain Education      Exclude from Growth Chart     Most recent vital signs: Vitals:   01/10/23 2048  BP: 136/86  Pulse: (!) 108  Resp: 16  Temp: 98.8 F (37.1 C)  SpO2: 100%    General: Awake, moderate distress, states she is in severe pain. CV:  Good peripheral perfusion.  Resp:  Normal effort.  Abd:  No distention.  Other:  Fractured tooth in the left upper jaw, tender to percussion, no signs of abscess, mild swelling of the left upper cheek when compared to the right.   ED Results / Procedures / Treatments   Labs (all labs ordered are listed, but only abnormal results are displayed) Labs Reviewed - No data to display   PROCEDURES:  Critical Care performed: No  Procedures   MEDICATIONS ORDERED IN ED: Medications  oxyCODONE-acetaminophen (PERCOCET/ROXICET) 5-325 MG per tablet 1 tablet (1 tablet Oral Given 01/10/23 2243)  amoxicillin (AMOXIL) capsule 500 mg (500 mg Oral Given 01/10/23 2243)     IMPRESSION / MDM / ASSESSMENT AND PLAN / ED COURSE  I reviewed the triage  vital signs and the nursing notes.                             32 year old female presents for evaluation of left-sided upper dental pain.  Patient was tachycardic in triage otherwise vital signs stable.  Patient in moderate distress on exam stating that she is in severe pain.  Differential diagnosis includes, but is not limited to, dental fracture, abscess, pulpitis.  Patient's presentation is most consistent with acute, uncomplicated illness.  Patient does have a fractured tooth in her left upper jaw which I believe is the cause of her pain.  I will give her some oral antibiotics and pain medication but explained that her symptoms will not improve until she is seen by a dentist.  Patient voiced understanding, all questions were answered and she is stable at discharge.      FINAL CLINICAL IMPRESSION(S) / ED DIAGNOSES   Final diagnoses:  Pain, dental     Rx / DC Orders   ED Discharge Orders  Ordered    amoxicillin (AMOXIL) 500 MG tablet  3 times daily        01/10/23 2243    oxyCODONE-acetaminophen (PERCOCET) 5-325 MG tablet  Every 4 hours PRN        01/10/23 2243             Note:  This document was prepared using Dragon voice recognition software and may include unintentional dictation errors.   Cameron Ali, PA-C 01/10/23 2246    Willy Eddy, MD 01/10/23 [phone]

## 2023-01-10 NOTE — ED Notes (Signed)
RN answered call light. Pt and family very upset that she hasn't been given any pain medication yet for her dental pain. RN checked chart to see if there were orders and there were not. Pt notified. MD notified.

## 2023-01-10 NOTE — Discharge Instructions (Addendum)
Take the antibiotics as prescribed.  Do not drive after taking the Percocet, as this can make you feel drowsy.  Please follow-up with a dentist.

## 2023-10-08 IMAGING — CR DG CHEST 2V
1 series · 2 of 2 positions shown · non-contrast
Comparison: None.

CLINICAL DATA: Chest pain

EXAM:
CHEST - 2 VIEW

[Series 1: dg chest 2 view · 0.14mm/px · 2 of 2 slices shown]
[im 1/2]
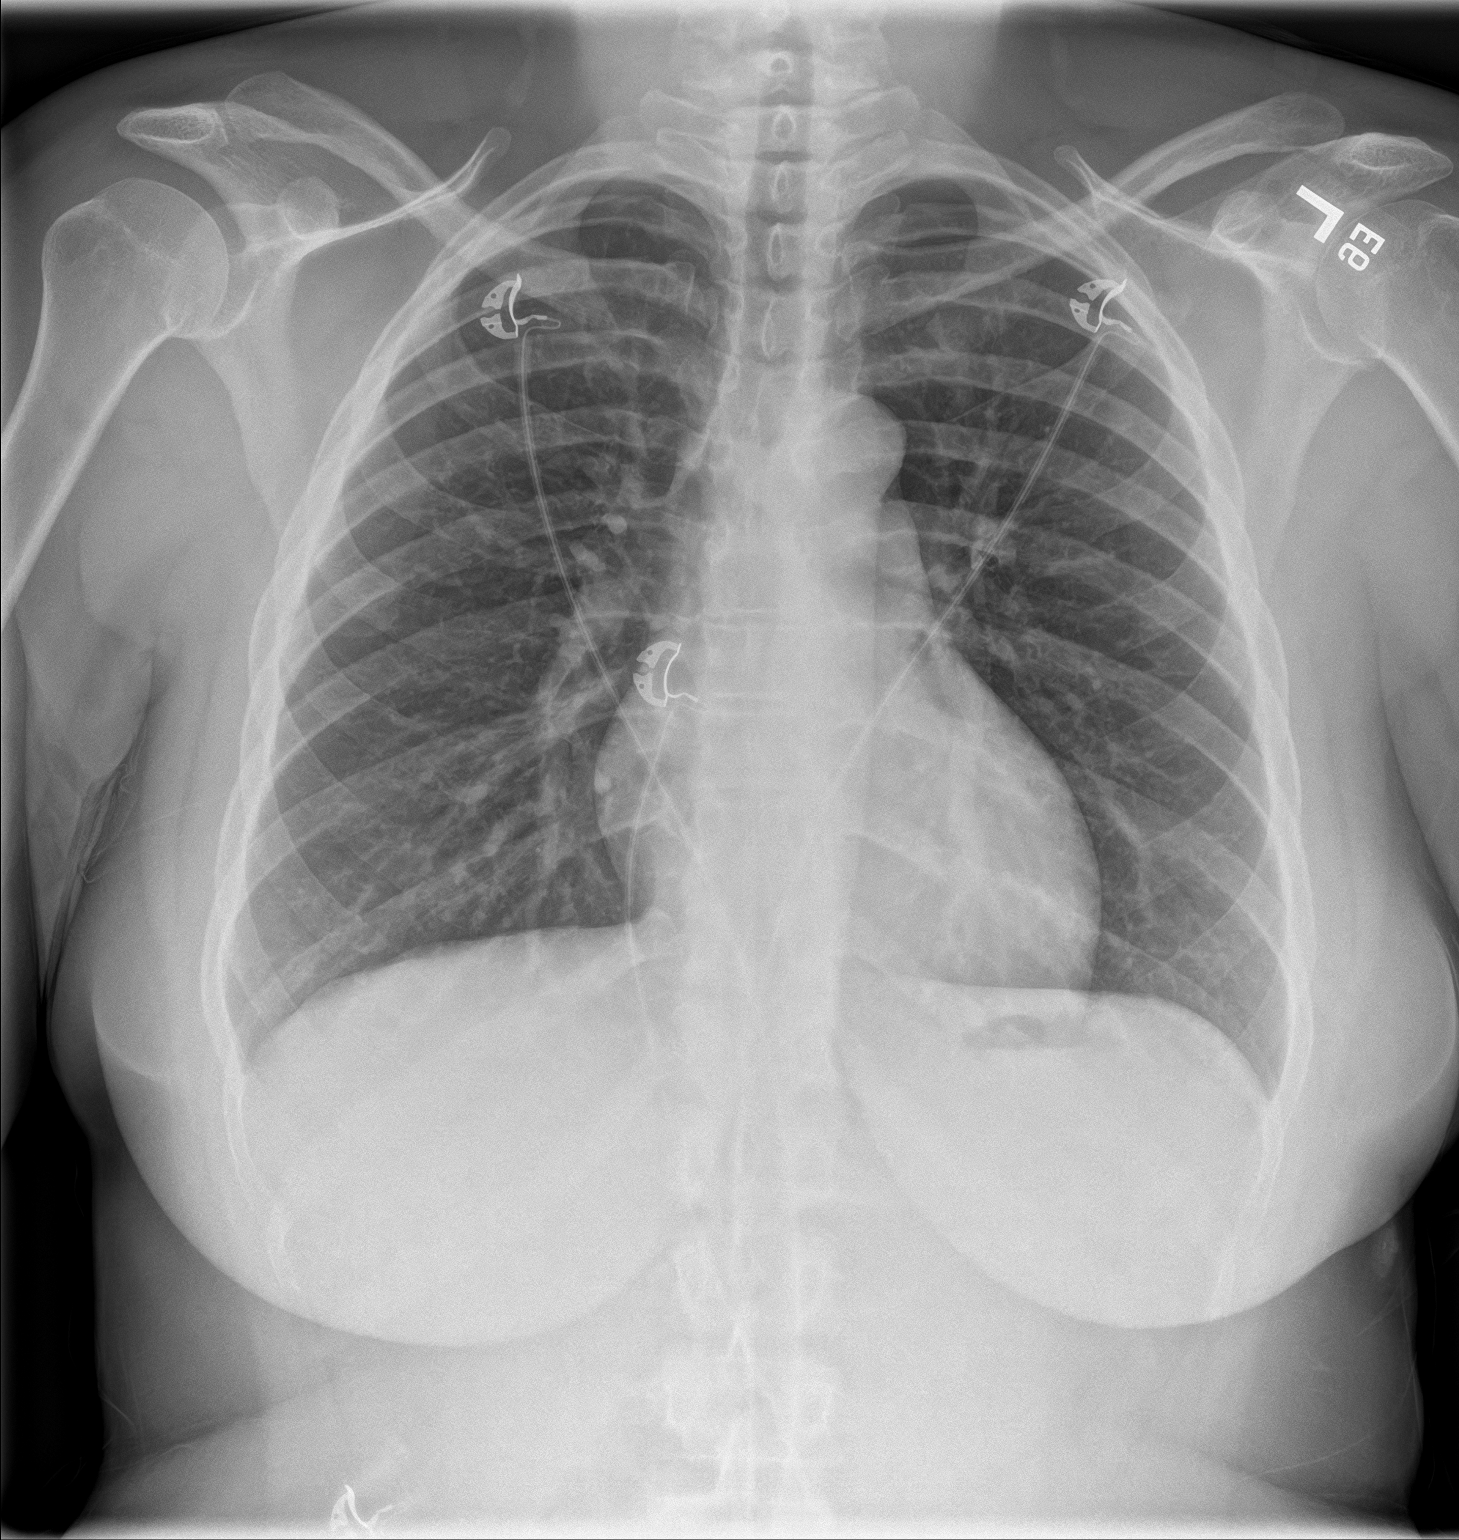
[im 2/2]
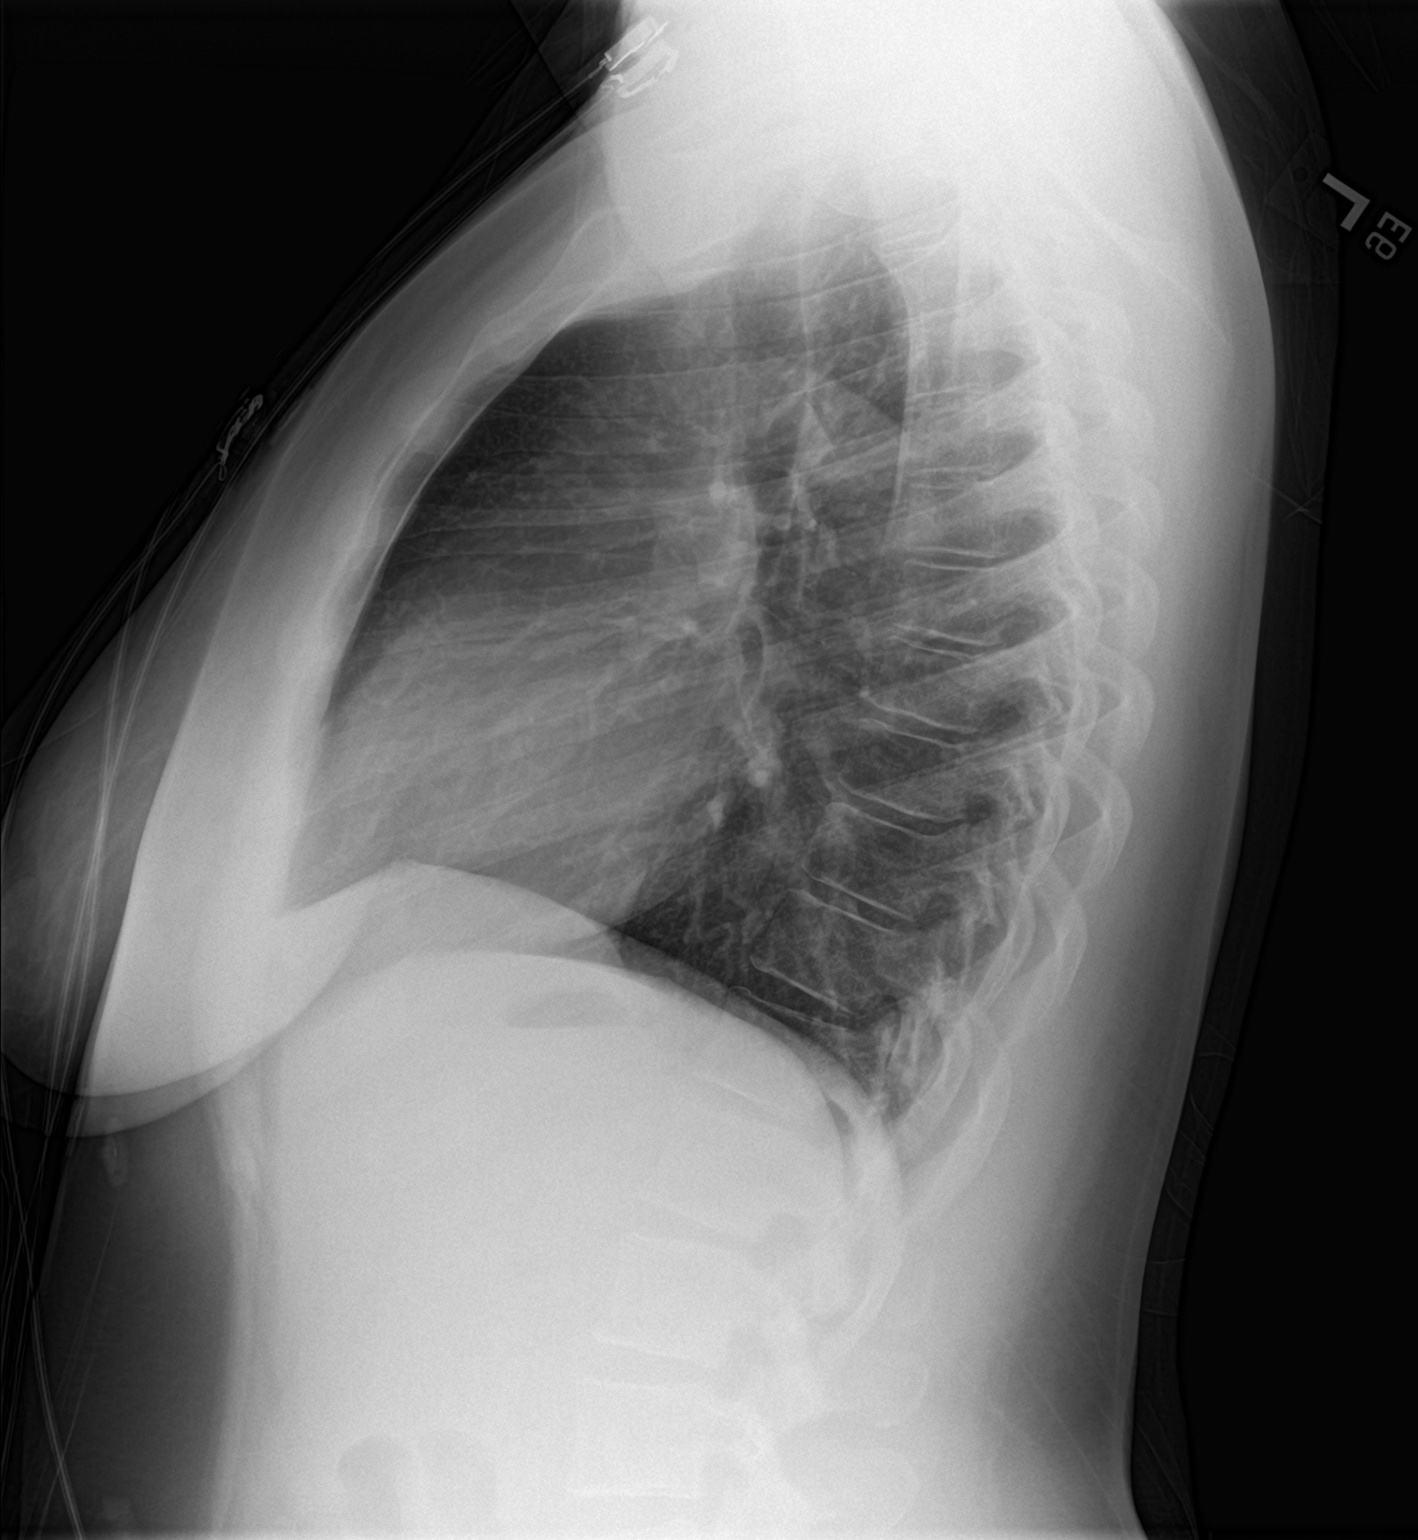

[2 of 2 positions shown; findings below may reference images not displayed]

FINDINGS: The heart size and mediastinal contours are within normal limits.
Both lungs are clear. The visualized skeletal structures are
unremarkable.
IMPRESSION: No active cardiopulmonary disease.
